# Patient Record
Sex: Male | Born: 1935 | Race: Black or African American | Hispanic: No | Marital: Married | Smoking: Never smoker
Health system: Southern US, Community
[De-identification: ages and names within clinical notes are randomized; demographics above are authoritative.]

## PROBLEM LIST (undated history)

## (undated) DIAGNOSIS — G459 Transient cerebral ischemic attack, unspecified: Secondary | ICD-10-CM

## (undated) DIAGNOSIS — F32A Depression, unspecified: Secondary | ICD-10-CM

## (undated) DIAGNOSIS — F329 Major depressive disorder, single episode, unspecified: Secondary | ICD-10-CM

## (undated) DIAGNOSIS — G309 Alzheimer's disease, unspecified: Secondary | ICD-10-CM

## (undated) DIAGNOSIS — R1310 Dysphagia, unspecified: Secondary | ICD-10-CM

## (undated) DIAGNOSIS — F0280 Dementia in other diseases classified elsewhere without behavioral disturbance: Secondary | ICD-10-CM

---

## 2005-07-15 ENCOUNTER — Emergency Department: Payer: Self-pay | Admitting: Emergency Medicine

## 2005-07-24 ENCOUNTER — Emergency Department: Payer: Self-pay | Admitting: Unknown Physician Specialty

## 2013-06-02 ENCOUNTER — Inpatient Hospital Stay: Payer: Self-pay | Admitting: Internal Medicine

## 2013-06-02 LAB — URINALYSIS, COMPLETE
Bilirubin,UR: NEGATIVE
Glucose,UR: NEGATIVE mg/dL
Ketone: NEGATIVE
Nitrite: NEGATIVE
Ph: 5
Protein: 100
RBC,UR: 9 /HPF
Specific Gravity: 1.01
Squamous Epithelial: <1
WBC UR: 213 /HPF

## 2013-06-02 LAB — CBC WITH DIFFERENTIAL/PLATELET
Basophil #: 0.1 x10 3/mm 3
Basophil %: 0.3 %
Eosinophil #: 0.1 x10 3/mm 3
Eosinophil %: 0.3 %
HCT: 38.2 % — ABNORMAL LOW
HGB: 12.7 g/dL — ABNORMAL LOW
Lymphocyte %: 3.1 %
Lymphs Abs: 0.6 x10 3/mm 3 — ABNORMAL LOW
MCH: 27.9 pg
MCHC: 33.2 g/dL
MCV: 84 fL
Monocyte #: 0.9 "x10 3/mm "
Monocyte %: 4.6 %
Neutrophil #: 18.6 x10 3/mm 3 — ABNORMAL HIGH
Neutrophil %: 91.7 %
Platelet: 163 x10 3/mm 3
RBC: 4.56 x10 6/mm 3
RDW: 14.5 %
WBC: 20.3 x10 3/mm 3 — ABNORMAL HIGH

## 2013-06-02 LAB — CK-MB: CK-MB: 36.3 ng/mL — ABNORMAL HIGH

## 2013-06-02 LAB — BASIC METABOLIC PANEL WITH GFR
Anion Gap: 11
BUN: 69 mg/dL — ABNORMAL HIGH
Calcium, Total: 9.9 mg/dL
Chloride: 97 mmol/L — ABNORMAL LOW
Co2: 27 mmol/L
Creatinine: 2.9 mg/dL — ABNORMAL HIGH
EGFR (African American): 21 — ABNORMAL LOW
EGFR (Non-African Amer.): 18 — ABNORMAL LOW
Glucose: 139 mg/dL — ABNORMAL HIGH
Osmolality: 292
Potassium: 3.1 mmol/L — ABNORMAL LOW
Sodium: 135 mmol/L — ABNORMAL LOW

## 2013-06-02 LAB — TROPONIN I: Troponin-I: 0.32 ng/mL — ABNORMAL HIGH

## 2013-06-03 DIAGNOSIS — I059 Rheumatic mitral valve disease, unspecified: Secondary | ICD-10-CM

## 2013-06-03 LAB — CBC WITH DIFFERENTIAL/PLATELET
Basophil #: 0 x10 3/mm 3
Basophil %: 0.1 %
Eosinophil #: 0.1 x10 3/mm 3
Eosinophil %: 0.6 %
HCT: 35 % — ABNORMAL LOW
HGB: 11.8 g/dL — ABNORMAL LOW
Lymphocyte %: 3.3 %
Lymphs Abs: 0.5 x10 3/mm 3 — ABNORMAL LOW
MCH: 28.1 pg
MCHC: 33.6 g/dL
MCV: 84 fL
Monocyte #: 0.9 "x10 3/mm "
Monocyte %: 5.6 %
Neutrophil #: 13.8 x10 3/mm 3 — ABNORMAL HIGH
Neutrophil %: 90.4 %
Platelet: 143 x10 3/mm 3 — ABNORMAL LOW
RBC: 4.19 x10 6/mm 3 — ABNORMAL LOW
RDW: 14.8 % — ABNORMAL HIGH
WBC: 15.3 x10 3/mm 3 — ABNORMAL HIGH

## 2013-06-03 LAB — BASIC METABOLIC PANEL
Anion Gap: 12 (ref 7–16)
BUN: 66 mg/dL — AB (ref 7–18)
CALCIUM: 9.5 mg/dL (ref 8.5–10.1)
CHLORIDE: 102 mmol/L (ref 98–107)
CO2: 26 mmol/L (ref 21–32)
Creatinine: 2.68 mg/dL — ABNORMAL HIGH (ref 0.60–1.30)
GFR CALC AF AMER: 24 — AB
GFR CALC NON AF AMER: 20 — AB
GLUCOSE: 69 mg/dL (ref 65–99)
Osmolality: 297 (ref 275–301)
POTASSIUM: 2.9 mmol/L — AB (ref 3.5–5.1)
Sodium: 140 mmol/L (ref 136–145)

## 2013-06-03 LAB — MAGNESIUM: Magnesium: 2.1 mg/dL

## 2013-06-03 LAB — CK-MB
CK-MB: 30.5 ng/mL — ABNORMAL HIGH
CK-MB: 15.4 ng/mL — ABNORMAL HIGH

## 2013-06-03 LAB — TROPONIN I
Troponin-I: 0.26 ng/mL — ABNORMAL HIGH
Troponin-I: 0.31 ng/mL — ABNORMAL HIGH

## 2013-06-03 LAB — HEMOGLOBIN A1C: Hemoglobin A1C: 7.4 % — ABNORMAL HIGH

## 2013-06-04 LAB — CBC WITH DIFFERENTIAL/PLATELET
BASOS PCT: 0 %
Basophil #: 0 10*3/uL (ref 0.0–0.1)
EOS ABS: 0.1 10*3/uL (ref 0.0–0.7)
EOS PCT: 0.5 %
HCT: 35.2 % — ABNORMAL LOW (ref 40.0–52.0)
HGB: 11.6 g/dL — ABNORMAL LOW (ref 13.0–18.0)
LYMPHS PCT: 6.4 %
Lymphocyte #: 0.7 10*3/uL — ABNORMAL LOW (ref 1.0–3.6)
MCH: 27.7 pg (ref 26.0–34.0)
MCHC: 32.9 g/dL (ref 32.0–36.0)
MCV: 84 fL (ref 80–100)
Monocyte #: 0.9 x10 3/mm (ref 0.2–1.0)
Monocyte %: 8.9 %
Neutrophil #: 8.9 10*3/uL — ABNORMAL HIGH (ref 1.4–6.5)
Neutrophil %: 84.2 %
PLATELETS: 145 10*3/uL — AB (ref 150–440)
RBC: 4.17 10*6/uL — AB (ref 4.40–5.90)
RDW: 14.8 % — AB (ref 11.5–14.5)
WBC: 10.5 10*3/uL (ref 3.8–10.6)

## 2013-06-04 LAB — BASIC METABOLIC PANEL
ANION GAP: 4 — AB (ref 7–16)
BUN: 66 mg/dL — AB (ref 7–18)
CHLORIDE: 111 mmol/L — AB (ref 98–107)
Calcium, Total: 9.4 mg/dL (ref 8.5–10.1)
Co2: 28 mmol/L (ref 21–32)
Creatinine: 2.17 mg/dL — ABNORMAL HIGH (ref 0.60–1.30)
EGFR (African American): 30 — ABNORMAL LOW
EGFR (Non-African Amer.): 26 — ABNORMAL LOW
GLUCOSE: 81 mg/dL (ref 65–99)
Osmolality: 303 (ref 275–301)
POTASSIUM: 4 mmol/L (ref 3.5–5.1)
Sodium: 143 mmol/L (ref 136–145)

## 2013-06-04 LAB — HEMOGLOBIN A1C: Hemoglobin A1C: 7.4 % — ABNORMAL HIGH (ref 4.2–6.3)

## 2013-06-05 LAB — BASIC METABOLIC PANEL
ANION GAP: 3 — AB (ref 7–16)
BUN: 49 mg/dL — AB (ref 7–18)
CALCIUM: 8.9 mg/dL (ref 8.5–10.1)
CREATININE: 1.75 mg/dL — AB (ref 0.60–1.30)
Chloride: 109 mmol/L — ABNORMAL HIGH (ref 98–107)
Co2: 27 mmol/L (ref 21–32)
EGFR (African American): 39 — ABNORMAL LOW
EGFR (Non-African Amer.): 34 — ABNORMAL LOW
Glucose: 119 mg/dL — ABNORMAL HIGH (ref 65–99)
Osmolality: 292 (ref 275–301)
Potassium: 3.9 mmol/L (ref 3.5–5.1)
Sodium: 139 mmol/L (ref 136–145)

## 2013-06-05 LAB — URINE CULTURE

## 2013-06-06 LAB — BASIC METABOLIC PANEL
Anion Gap: 8 (ref 7–16)
BUN: 31 mg/dL — ABNORMAL HIGH (ref 7–18)
CREATININE: 1.45 mg/dL — AB (ref 0.60–1.30)
Calcium, Total: 8.5 mg/dL (ref 8.5–10.1)
Chloride: 109 mmol/L — ABNORMAL HIGH (ref 98–107)
Co2: 24 mmol/L (ref 21–32)
EGFR (African American): 49 — ABNORMAL LOW
EGFR (Non-African Amer.): 43 — ABNORMAL LOW
GLUCOSE: 100 mg/dL — AB (ref 65–99)
Osmolality: 288 (ref 275–301)
POTASSIUM: 3.9 mmol/L (ref 3.5–5.1)
SODIUM: 141 mmol/L (ref 136–145)

## 2013-06-07 LAB — CULTURE, BLOOD (SINGLE)

## 2013-06-13 ENCOUNTER — Encounter: Payer: Medicare Other | Admitting: Cardiology

## 2014-08-24 NOTE — H&P (Signed)
PATIENT NAME:  Marc Cantrell, Marc Cantrell MR#:  [id number] DATE OF BIRTH:  03/07/1923  DATE OF ADMISSION:  06/02/2013  PRIMARY CARE PHYSICIAN:  [facility].   REFERRING PHYSICIAN:  Dr. Janalyn Harder.   CHIEF COMPLAINT:  Recurrent fall and confusion.   HISTORY OF PRESENT ILLNESS:  The patient is an 81 year old man with a known history of diabetes, prostate cancer, hypertension, who is being admitted for suspected sepsis due to UTI, confusion and recurrent fall.  The patient lives at home alone where he was noted to be falling more than usual.  He fell once on Wednesday and twice today.  His legs give out.  Per his daughter the patient had a knot to the back of his head.  No bleeding.  As per his daughter, the patient was somewhat more confused than normal.  He was asking his daughter on Wednesday what day is that, but today he looks okay.  As per daughter he was also somewhat short of breath since yesterday.  He normally uses a walker or cane.  Per his daughter the patient is not eating or drinking well for the last few days.  Does not have much appetite.  The patient reports having some hot flashes and also reports some weight loss, although he could not quantify.    PAST MEDICAL HISTORY: 1.  History of prostate cancer. 2.  Diabetes.  3.  Hypertension.   ALLERGIES:  Non known drug allergies.   SOCIAL HISTORY:  No smoking, alcohol or IV drugs of abuse.  He lives alone.    MEDICATIONS AT HOME:  Not quite clear as patient does not recall and family does not have it, but they will check at home tomorrow and will bring it over.  Current list per them is as follows:  1.  Aspirin 81 mg by mouth daily. 2.  Calcium with vitamin D 1 tablet by mouth daily.  3.  Cyanocobalamin 1000 mcg by mouth daily.  4.  Glipizide 5 mg by mouth twice daily.  5.  HCTZ 25 mg by mouth daily.  6.  Lipitor 40 mg by mouth at bedtime.    PAST SURGICAL HISTORY:  Some kind of abdomen surgery.   FAMILY HISTORY:  Mother had DJD.  Both  mother and father died of natural causes.  REVIEW OF SYSTEMS:  CONSTITUTIONAL:  Positive for low-grade fever, fatigue and weakness.  Also reports weight loss, although could not quantify.   EYES:  No blurred or double vision.  EARS, NOSE, THROAT:  No tinnitus or ear pain.  He does have some hearing loss.  RESPIRATORY:  No cough, hemoptysis.  Positive for shortness of breath.  CARDIOVASCULAR:  No chest pain, orthopnea, edema. GASTROINTESTINAL:  No nausea, vomiting, diarrhea.  Positive for poor appetite and poor by mouth intake. GENITOURINARY:  No dysuria, hematuria. ENDOCRINE:  No polyuria, nocturia. HEMATOLOGY:  No anemia, easy bruising. SKIN:  No rash or lesion. MUSCULOSKELETAL:  Chronic arthritis.  No muscle cramp.  NEUROLOGIC:  No tingling, numbness.  Positive for weakness.  Also has recurrent fall due to a balance issue and legs giving out.  PSYCHIATRY:  No history of anxiety or depression.   PHYSICAL EXAMINATION: VITAL SIGNS:  Temperature 99.5, heart rate 87 per minute, respirations 17 per minute, blood pressure 106/57 mmHg.  He was saturating 95% on room air.  GENERAL:  The patient is an 80 year old male lying in the bed comfortably without any acute distress.  EYES:  Pupils equal, round, reactive to light  and accommodation.  No scleral icterus.  Extraocular muscles intact.  HEENT:  Head atraumatic, normocephalic.  Oropharynx and nasopharynx clear. NECK:  Supple.  No jugular venous distention.  No thyroid enlargement or tenderness.  LUNGS:  Clear to auscultation bilaterally.  No wheezing, rales, rhonchi or crepitation. CARDIOVASCULAR:  S1, S2 normal.  No murmurs, rubs or gallops.  ABDOMEN:  Soft.  He had some minimal tenderness in the right lower quadrant.  No guarding or rigidity.  No organomegaly appreciated.  LOWER EXTREMITIES:  No pedal edema, cyanosis or clubbing.  NEUROLOGIC:  Cranial nerves II through XII intact.  Muscle strength 5 out of 5 in all extremities. Sensation  intact.  PSYCHIATRIC:  The patient is alert and oriented x 3.  He is somewhat slow with speech.  He seemed to have some difficulty hearing.  SKIN:  No obvious rash, lesion, ulcer.  MUSCULOSKELETAL:  No joint effusion or tenderness.  He does have DJD and some joint crepitation at times.  VASCULAR:  Good peripheral pulsation.   LABORATORY AND RADIOLOGICAL DATA:  CBC within normal limits except white count of 20.3, hemoglobin of 12.7, hematocrit 38.2.  BMP showed sodium 135, potassium 3.1, chloride 97, CO2 27, BUN 69, creatinine 2.90.  Blood sugar 139.  CK MB of 36.2, troponin 0.32.  UA showed WBC of 213, 3+ bacteria, 3+ leukocyte esterase, WBC in clumps present.   Chest x-ray in the ED showed no acute cardiopulmonary disease.   CT scan of the head without contrast in the ED showed no acute intracranial findings.  No intracranial trauma.  Atrophy and microvascular disease seen.   EKG shows a normal sinus rhythm.  No major ST-T changes.   IMPRESSION AND PLAN: 1.  Sepsis with low-grade fever, leukocytosis, some confusion, likely secondary to urinary source.  We will obtain blood and urine cultures and we will start him on IV Rocephin.  2.  Acute renal failure, likely prerenal.  We will hydrate him with IV fluids and monitor.  I will avoid any nephrotoxins.  3.  Difficulty with ambulation with recurrent fall.  We will get a physical therapy, occupational therapy consult.  Consult care management and social worker for possible placement.  4.  Diabetes mellitus.  We will start on sliding scale insulin.  Continue his glipizide.  Check hemoglobin A1c.  5.  Urinary tract infection, based on urinalysis.  We will get urine culture and sensitivity, start him on IV Rocephin.  6.  Hypertension, well controlled at this time.  7.  CODE STATUS:  DO NOT RESUSCITATE.    ____________________________ Lynnda Wiersma S. Sherryll BurgerShah, MD vss:ea D: 06/02/2013 23:59:06 ET T: 06/03/2013 01:40:28 ET JOB#: [id number]  cc: Tayra Dawe S. Sherryll BurgerShah,  MD, <Dictator> [facility] SiaVIPUL S [facility] MD ELECTRONICALLY SIGNED 06/06/2013 16:39

## 2014-08-24 NOTE — Discharge Summary (Signed)
PATIENT NAME:  Marc Cantrell, [name] MR#:  [id number] DATE OF BIRTH:  [birth year]  DATE OF ADMISSION:  06/02/2013 DATE OF DISCHARGE:  06/06/2013   ADMITTING PHYSICIAN: Vipul S. Sherryll BurgerShah, MD  DISCHARGING PHYSICIAN: Enid Baasadhika Shaarav Ripple, MD  [facility]   CONSULTATIONS IN THE HOSPITAL: None.   DISCHARGE DIAGNOSES:  1. Escherichia coli sepsis and urinary tract infection.  2. Diabetes mellitus.  3. Adult failure to thrive.  4. Acute renal failure, which is resolved.   DISCHARGE HOME MEDICATIONS:  1. Lipitor 40 mg p.o. at bedtime.  2. Aspirin 81 mg p.o. daily. 3. Calcium/vitamin D 600 mg/200 international units orally twice a day.  4. Omeprazole 20 mg p.o. daily.  5. Cyanocobalamin 1000 mcg p.o. daily.  6. Glipizide 5 mg p.o. daily.  7. Trazodone 25 mg p.o. at bedtime.  8. Levaquin 250 mg p.o. once a day for 10 days.    DISCHARGE DIET: Regular diet along with Glucerna shakes 3 times a day with meals. Please send vanilla flavor.   DISCHARGE ACTIVITY: As tolerated.    FOLLOWUP INSTRUCTIONS:  1. PCP followup in 1 week.  2. Physical therapy.  3. Check basic metabolic panel in 1 to 2 weeks.  4. Follow up with cardiology in 2 weeks for PVCs and multiple arrhythmias.   LABORATORY AND IMAGING STUDIES PRIOR TO DISCHARGE:  Sodium 141, potassium 3.9, chloride 109, bicarbonate 24, BUN 31, creatinine 1.45, glucose 100 and calcium of 8.5.  WBC 10.5, hemoglobin 11.6, hematocrit 35.2, platelet count 145. The hemoglobin A1c is at 7.4.  Urine cultures growing gram-negative rods.  Blood cultures on admission 2 sets growing E. coli which was pansensitive.  Troponins were slightly elevated at 0.26 and 0.31 likely secondary to demand ischemia from sepsis.  Echocardiogram showing significant PVCs and atrial tachycardia, EF of 60% to 65%. Normal global left ventricular systolic function noted.   BRIEF HOSPITAL COURSE: Marc Cantrell is an 81 year old male with past medical history significant  for hypertension, diabetes and a history of prostate cancer, who lives at home by himself, who was brought in secondary to confusion and recurrent falls.   1. Altered mental status, acute metabolic encephalopathy secondary to his underlying sepsis. He was very lethargic when he came in. His blood cultures were growing E. coli and urine with gram-negative rods. His blood pressure was stable, and he was started on IV Rocephin, which improved his mental status and is currently back to his baseline. Based on the sensitivities, his Rocephin is being changed over to Levaquin at the time of discharge for a total 2-week treatment. He also worked with physical therapy, who recommended rehab for his recurrent falls and generalized weakness.   2. Mild dysphagia in the hospital. The patient complained of some dysphagia contributing to his failure to thrive; however, had a barium swallow which showed no strictures or masses and possibly presbyesophagus was noted. However, his diet has improved while he was here in the hospital, and he is being supplemented with Glucerna shakes with meals, which will be continued over at the rehab too.   3. Diabetes mellitus. Sugars were low-normal initially. He is on glipizide, which was changed over to daily instead of b.i.d. at the time of discharge.  4. Acute renal failure on admission, likely secondary to dehydration with acute tubular necrosis and prerenal. His hydrochlorothiazide is being stopped. His blood pressure is stable while in the hospital, and after IV fluids, his renal function has improved significantly.  5. Multiple premature  atrial contractions and premature ventricular contractions on the monitor. Electrolytes were normal. The patient was asymptomatic. He will be advised to follow up with cardiology as an outpatient in 1 to 2 weeks. His echocardiogram showed normal systolic function.  CODE STATUS: Do not resuscitate. That was discussed with the patient prior to  discharge and also daughter.   DISCHARGE CONDITION: Stable.   DISCHARGE DISPOSITION: To short-term rehab.   TIME SPENT ON DISCHARGE: 45 minutes.   ____________________________ Enid Baas, MD rk:lb D: 06/06/2013 09:39:56 ET T: 06/06/2013 10:27:43 ET JOB#: [id number]  cc: Enid Baas, MD, <Dictator> Enid Baas MD ELECTRONICALLY SIGNED 06/07/2013 14:59

## 2016-08-19 ENCOUNTER — Emergency Department: Payer: Medicare Other

## 2016-08-19 ENCOUNTER — Encounter: Payer: Self-pay | Admitting: Emergency Medicine

## 2016-08-19 ENCOUNTER — Emergency Department
Admission: EM | Admit: 2016-08-19 | Discharge: 2016-08-19 | Disposition: A | Discharge status: Home / Self Care | Payer: Medicare Other | Attending: Emergency Medicine | Admitting: Emergency Medicine

## 2016-08-19 DIAGNOSIS — G308 Other Alzheimer's disease: Secondary | ICD-10-CM | POA: Insufficient documentation

## 2016-08-19 DIAGNOSIS — W0110XA Fall on same level from slipping, tripping and stumbling with subsequent striking against unspecified object, initial encounter: Secondary | ICD-10-CM | POA: Diagnosis not present

## 2016-08-19 DIAGNOSIS — Y929 Unspecified place or not applicable: Secondary | ICD-10-CM | POA: Diagnosis not present

## 2016-08-19 DIAGNOSIS — F0280 Dementia in other diseases classified elsewhere without behavioral disturbance: Secondary | ICD-10-CM | POA: Diagnosis not present

## 2016-08-19 DIAGNOSIS — Y999 Unspecified external cause status: Secondary | ICD-10-CM | POA: Diagnosis not present

## 2016-08-19 DIAGNOSIS — S0003XA Contusion of scalp, initial encounter: Secondary | ICD-10-CM | POA: Insufficient documentation

## 2016-08-19 DIAGNOSIS — Y939 Activity, unspecified: Secondary | ICD-10-CM | POA: Diagnosis not present

## 2016-08-19 DIAGNOSIS — Z79899 Other long term (current) drug therapy: Secondary | ICD-10-CM | POA: Diagnosis not present

## 2016-08-19 DIAGNOSIS — W19XXXA Unspecified fall, initial encounter: Secondary | ICD-10-CM

## 2016-08-19 DIAGNOSIS — IMO0002 Reserved for concepts with insufficient information to code with codable children: Secondary | ICD-10-CM

## 2016-08-19 DIAGNOSIS — S0990XA Unspecified injury of head, initial encounter: Secondary | ICD-10-CM | POA: Diagnosis present

## 2016-08-19 HISTORY — DX: Transient cerebral ischemic attack, unspecified: G45.9

## 2016-08-19 HISTORY — DX: Depression, unspecified: F32.A

## 2016-08-19 HISTORY — DX: Dementia in other diseases classified elsewhere, unspecified severity, without behavioral disturbance, psychotic disturbance, mood disturbance, and anxiety: F02.80

## 2016-08-19 HISTORY — DX: Dysphagia, unspecified: R13.10

## 2016-08-19 HISTORY — DX: Major depressive disorder, single episode, unspecified: F32.9

## 2016-08-19 HISTORY — DX: Alzheimer's disease, unspecified: G30.9

## 2016-08-19 NOTE — ED Provider Notes (Signed)
[facility] Emergency Department Provider Note  ____________________________________________   First MD Initiated Contact with Patient 08/19/16 1914     (approximate)  I have reviewed the triage vital signs and the nursing notes.   HISTORY  Chief Complaint Fall   HPI Marc Cantrell is a 83 y.o. male with a history of dementia who is presenting after mechanical fall. The pulse witnessed by staff who says that he was putting his pants on when he fell backwards. There was no reported loss of consciousness. The patient does not remember the exact details of the fall. He is complaining of aching to the back of his head where he has a new cephalohematoma. Denies any neck pain. Denies any pain anywhere else in his body.   Past Medical History:  Diagnosis Date  . Alzheimer's dementia   . Depression   . Dysphagia   . TIA (transient ischemic attack)     There are no active problems to display for this patient.   History reviewed. No pertinent surgical history.  Prior to Admission medications   Medication Sig Start Date End Date Taking? Authorizing Provider  acetaminophen (TYLENOL) 325 MG tablet Take 975 mg by mouth 3 (three) times daily. For pain or fever   Yes Historical Provider, MD  bicalutamide (CASODEX) 50 MG tablet Take 50 mg by mouth daily.   Yes Historical Provider, MD  citalopram (CELEXA) 40 MG tablet Take 40 mg by mouth daily.   Yes Historical Provider, MD  clotrimazole (LOTRIMIN) 1 % cream Apply 1 application topically 2 (two) times daily. Apply topically between toes and soles of feet   Yes Historical Provider, MD  Melatonin 3 MG TABS Take 2 tablets by mouth at bedtime.   Yes Historical Provider, MD  pantoprazole (PROTONIX) 20 MG tablet Take 20 mg by mouth daily.   Yes Historical Provider, MD  senna-docusate (SENOKOT-S) 8.6-50 MG tablet Take 1 tablet by mouth 2 (two) times daily as needed for mild constipation.   Yes Historical Provider, MD     Allergies Patient has no known allergies.  No family history on file.  Social History Social History  Substance Use Topics  . Smoking status: Never Smoker  . Smokeless tobacco: Never Used  . Alcohol use No    Review of Systems Constitutional: No fever/chills Eyes: No visual changes. ENT: No sore throat. Cardiovascular: Denies chest pain. Respiratory: Denies shortness of breath. Gastrointestinal: No abdominal pain.  No nausea, no vomiting.  No diarrhea.  No constipation. Genitourinary: Negative for dysuria. Musculoskeletal: Negative for back pain. Skin: Negative for rash. Neurological: Negative for focal weakness or numbness.  10-point ROS otherwise negative.  ____________________________________________   PHYSICAL EXAM:  VITAL SIGNS: ED Triage Vitals [08/19/16 1916]  Enc Vitals Group     BP (!) 149/91     Pulse Rate 81     Resp 18     Temp 97.9 F (36.6 C)     Temp Source Oral     SpO2 98 %     Weight 138 lb 9.6 oz (62.9 kg)     Height  (1.702 m)     Head Circumference      Peak Flow      Pain Score      Pain Loc      Pain Edu?      Excl. in GC?     Constitutional: Alert and in no acute distress. Eyes: Conjunctivae are normal. PERRL. EOMI. Head: 2 x 3  cm oval-shaped hematoma to the right occipitoparietal region without any depression or bogginess. Ecchymosis in multiple stages of healing periorbitally, anteriorly. Also with what may be a new swelling over the right medial eyebrow which is about 1 cm and circular. No depression here as well. No tenderness to palpation to the cranium. Nose: No congestion/rhinnorhea. Mouth/Throat: Mucous membranes are moist.   Neck: No stridor.  No tenderness to palpation of the midline cervical spine. Ranges the head and neck freely. Cardiovascular: Normal rate, regular rhythm. Grossly normal heart sounds.  Respiratory: Normal respiratory effort.  No retractions. Lungs CTAB. Gastrointestinal: Soft and nontender. No  distention. No CVA tenderness. Musculoskeletal: No lower extremity tenderness nor edema.  5 out of 5 strength to bilateral lower extremities. Pelvis is stable and there is no tenderness of the hips bilaterally. Neurologic:  Normal speech and language. No gross focal neurologic deficits are appreciated.  Skin:  Skin is warm, dry and intact. No rash noted. Psychiatric: Mood and affect are normal. Speech and behavior are normal.  ____________________________________________   LABS (all labs ordered are listed, but only abnormal results are displayed)  Labs Reviewed - No data to display ____________________________________________  EKG   ____________________________________________  RADIOLOGY    CT Head Wo Contrast (Final result)  Result time 08/19/16 19:40:26  Final result by Rubye Oaks, MD (08/19/16 19:40:26)           Narrative:   CLINICAL DATA: Trip and fall striking head. No loss of consciousness. Occipital parietal hematoma.  EXAM: CT HEAD WITHOUT CONTRAST  TECHNIQUE: Contiguous axial images were obtained from the base of the skull through the vertex without intravenous contrast.  COMPARISON: Head CT 06/02/2013  FINDINGS: Brain: No evidence of acute infarction, hemorrhage, hydrocephalus, extra-axial collection or mass lesion/mass effect. Stable degree of atrophy and chronic small vessel ischemia. Unchanged probable remote lacunar infarct in the right basal ganglia.  Vascular: Atherosclerosis of skullbase vasculature without hyperdense vessel or abnormal calcification.  Skull: No skull fracture. Right parietal scalp hematoma.  Sinuses/Orbits: Minimal mucosal thickening of right ethmoid air cells. No fluid levels. Mastoid air cells are well-aerated.  Other: None.  IMPRESSION: 1. Right parietal occipital scalp hematoma. No fracture or acute intracranial abnormality. 2. Stable degree of atrophy and chronic small vessel ischemia.   Electronically  Signed By: Rubye Oaks M.D. On: 08/19/2016 19:40            ____________________________________________   PROCEDURES  Procedure(s) performed:   Procedures  Critical Care performed:   ____________________________________________   INITIAL IMPRESSION / ASSESSMENT AND PLAN / ED COURSE  Pertinent labs & imaging results that were available during my care of the patient were reviewed by me and considered in my medical decision making (see chart for details).  ----------------------------------------- 8:23 PM on 08/19/2016 -----------------------------------------  Patient is now accompanied by family who says that he is aware of a fall about a week and a half to 2 weeks ago which resulted in the frontal bruising. Patient with reassuring imaging at this time. Remains his baseline mental status. Will be discharged.      ____________________________________________   FINAL CLINICAL IMPRESSION(S) / ED DIAGNOSES  Fall. Cephalohematoma.    NEW MEDICATIONS STARTED DURING THIS VISIT:  New Prescriptions   No medications on file     Note:  This document was prepared using Dragon voice recognition software and may include unintentional dictation errors.    Myrna Blazer, MD 08/19/16 2024

## 2016-08-19 NOTE — ED Notes (Signed)
Pt to CT via stretcher accomp by CT tech 

## 2016-08-19 NOTE — ED Notes (Signed)
Report called to [place], [employer]

## 2016-08-19 NOTE — ED Triage Notes (Addendum)
Pt to room 1 via EMS from [facility]; EMS reports pt tripped and fell hitting head with no LOC; st he was trying to put his pants on and fell; pt alert to self only; calm, cooperative with no distress noted; pt st that his head hurts but denies any other c/o; bruising noted to cheeks and forehead in varying stages of healing based on color; PERRL, MAEW, no tenderness with palpation to extremities, chest, abd, neck, back, or head; resp even/unlab, lungs clear, apical audible & regular, +BS, abd soft/nondist

## 2016-08-19 NOTE — ED Notes (Signed)
Pt incont of urine; pt clensed, attends changed

## 2016-08-19 NOTE — ED Notes (Addendum)
Dr Pershing Proud in to speak with pt & family regarding d/c planning; pt resting quietly on stretcher with no distress noted; neurocheck remains unchanged; family requests that pt go back by EMS

## 2017-09-18 IMAGING — CT CT HEAD W/O CM
3 series · 15 of 46 positions shown, 18 images · non-contrast
Comparison: Head CT 06/02/2013

CLINICAL DATA: Trip and fall striking head. No loss of
consciousness. Occipital parietal hematoma.

EXAM:
CT HEAD WITHOUT CONTRAST
TECHNIQUE: Contiguous axial images were obtained from the base of the skull
through the vertex without intravenous contrast.

[Series 2: head wo · axial · 0.42mm/px · z∈[+610,+730]mm · 9 of 29 slices shown, 12 images]
[im 3/29  brain]
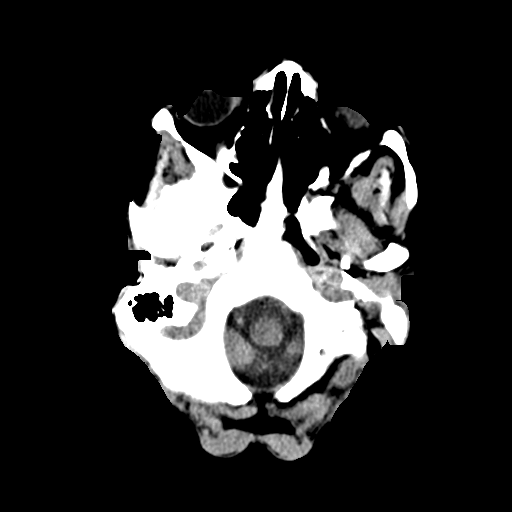
[im 3/29  bone]
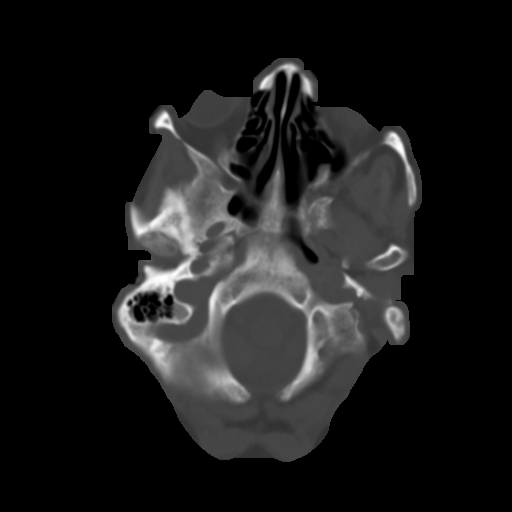
[im 6/29  brain]
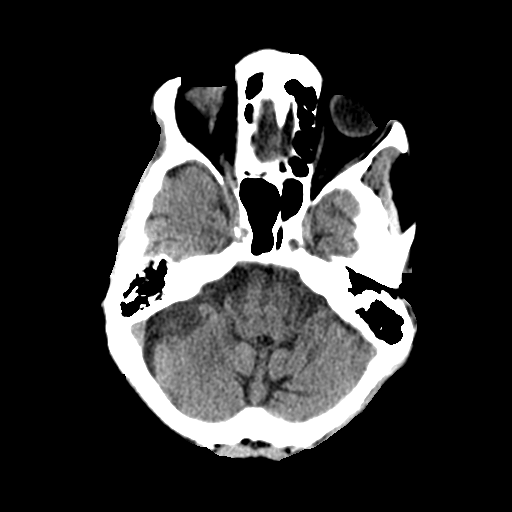
[im 9/29  brain]
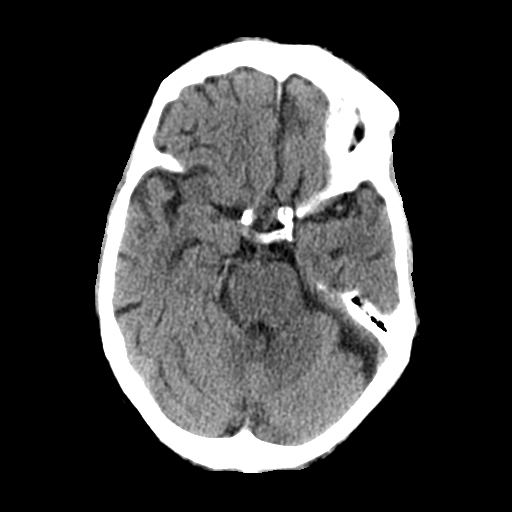
[im 12/29  brain]
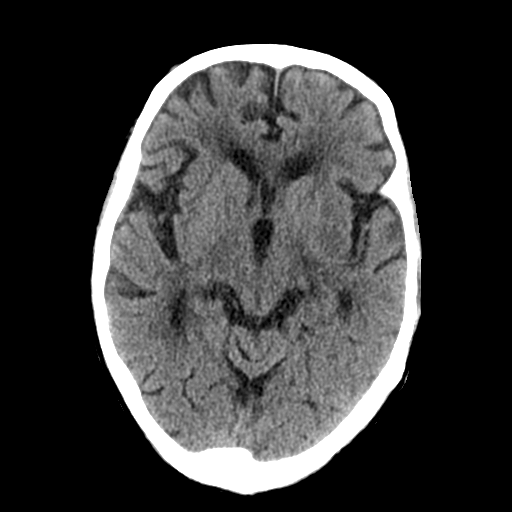
[im 15/29  brain]
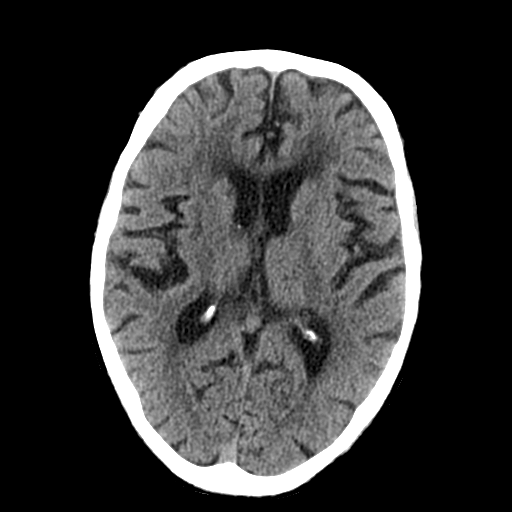
[im 15/29  bone]
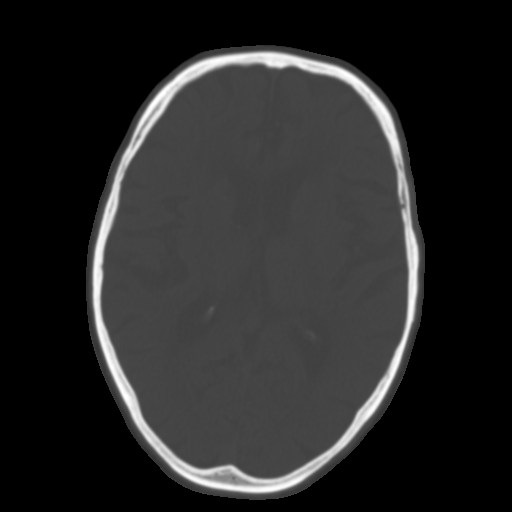
[im 18/29  brain]
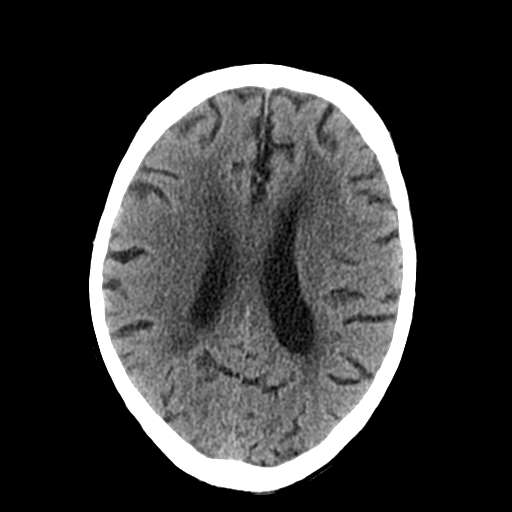
[im 21/29  brain]
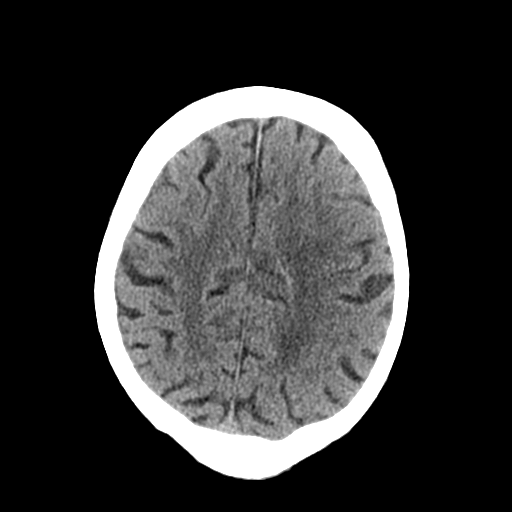
[im 24/29  brain]
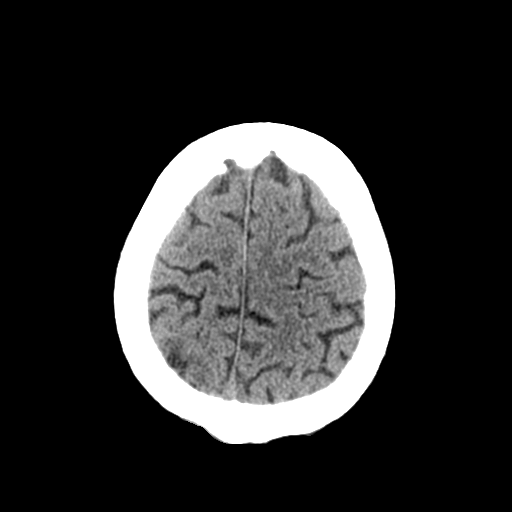
[im 27/29  brain]
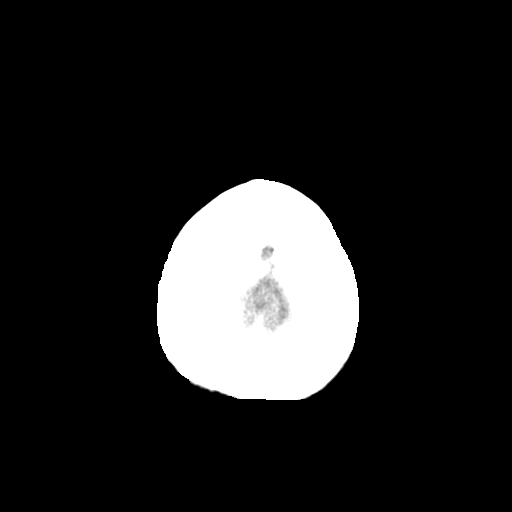
[im 27/29  bone]
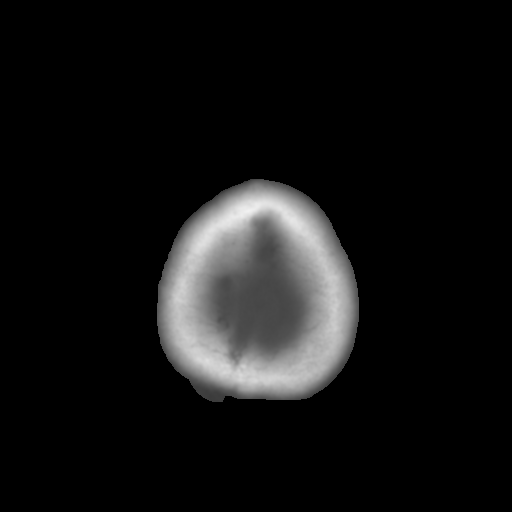

[Series 4: coronal soft tissue · coronal · 0.29mm/px · 3 of 65 slices shown]
[im 22/65  brain]
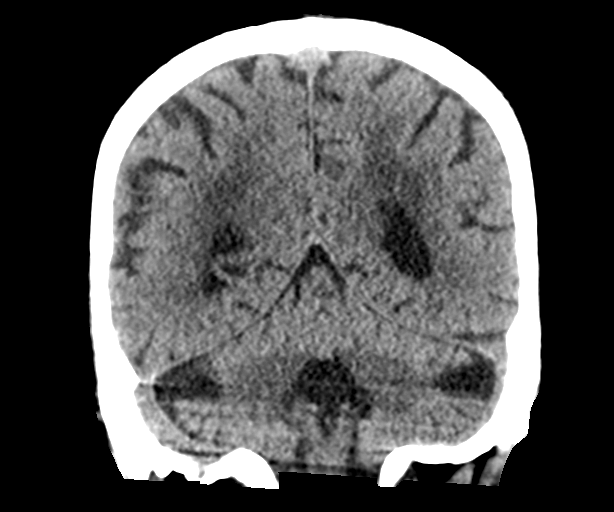
[im 29/65  brain]
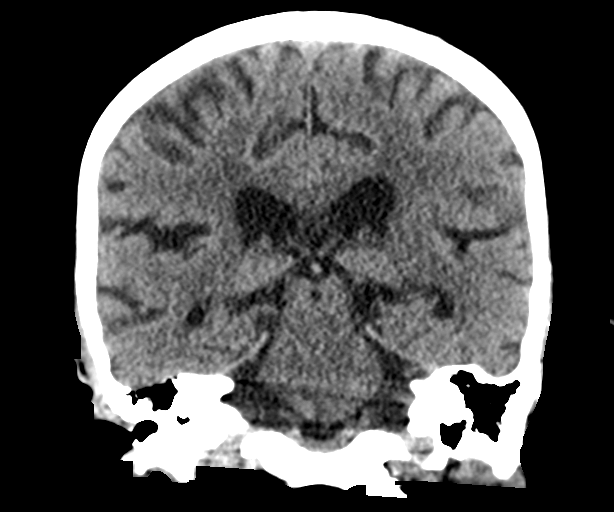
[im 36/65  brain]
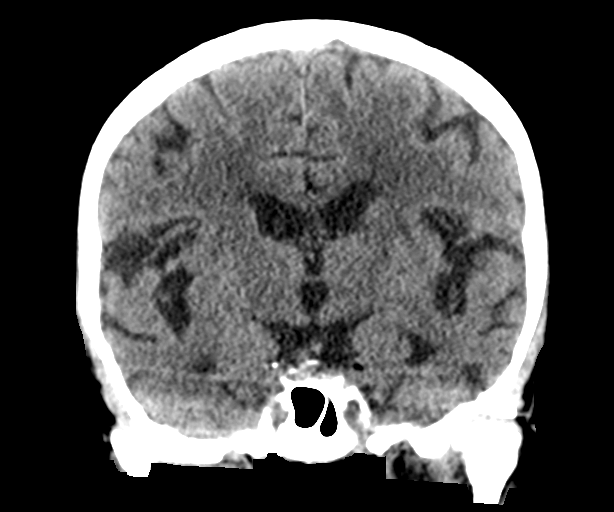

[Series 5: sagittal soft tissue · sagittal · 0.31mm/px · 3 of 50 slices shown]
[im 17/50  brain]
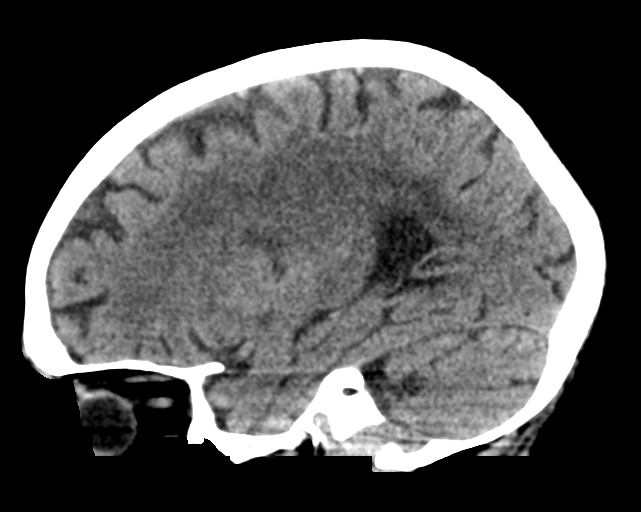
[im 25/50  brain]
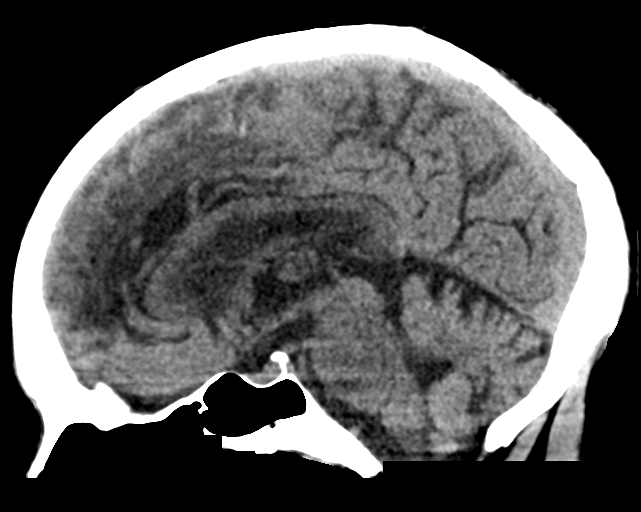
[im 33/50  brain]
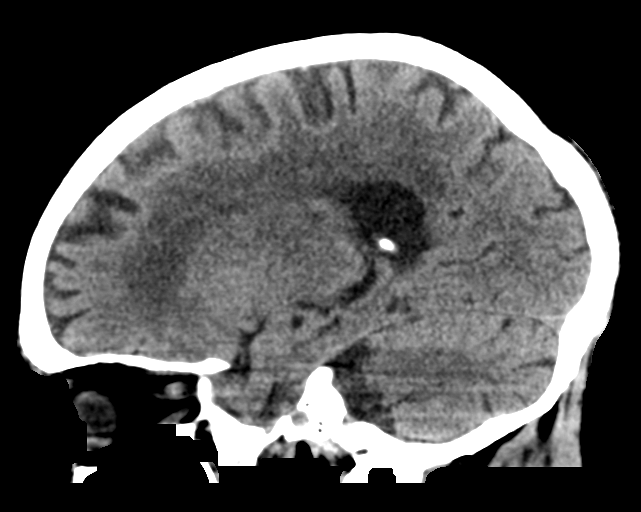

[15 of 46 positions shown; findings below may reference images not displayed]

FINDINGS: Brain: No evidence of acute infarction, hemorrhage, hydrocephalus,
extra-axial collection or mass lesion/mass effect. Stable degree of
atrophy and chronic small vessel ischemia. Unchanged probable remote
lacunar infarct in the right basal ganglia.

Vascular: Atherosclerosis of skullbase vasculature without
hyperdense vessel or abnormal calcification.

Skull: No skull fracture.  Right parietal scalp hematoma.

Sinuses/Orbits: Minimal mucosal thickening of right ethmoid air
cells. No fluid levels. Mastoid air cells are well-aerated.

Other: None.
IMPRESSION: 1. Right parietal occipital scalp hematoma. No fracture or acute
intracranial abnormality.
2. Stable degree of atrophy and chronic small vessel ischemia.

## 2018-01-31 DEATH — deceased
